# Patient Record
Sex: Female | Born: 1965 | Race: Black or African American | Hispanic: No | Marital: Single | Smoking: Never smoker
Health system: Southern US, Community
[De-identification: ages and names within clinical notes are randomized; demographics above are authoritative.]

## PROBLEM LIST (undated history)

## (undated) DIAGNOSIS — R7303 Prediabetes: Secondary | ICD-10-CM

## (undated) DIAGNOSIS — D219 Benign neoplasm of connective and other soft tissue, unspecified: Secondary | ICD-10-CM

## (undated) DIAGNOSIS — E785 Hyperlipidemia, unspecified: Secondary | ICD-10-CM

## (undated) DIAGNOSIS — R011 Cardiac murmur, unspecified: Secondary | ICD-10-CM

## (undated) DIAGNOSIS — I471 Supraventricular tachycardia: Secondary | ICD-10-CM

## (undated) DIAGNOSIS — E669 Obesity, unspecified: Secondary | ICD-10-CM

## (undated) DIAGNOSIS — T7840XA Allergy, unspecified, initial encounter: Secondary | ICD-10-CM

## (undated) HISTORY — DX: Supraventricular tachycardia: I47.1

## (undated) HISTORY — PX: NO PAST SURGERIES: SHX2092

## (undated) HISTORY — DX: Benign neoplasm of connective and other soft tissue, unspecified: D21.9

## (undated) HISTORY — DX: Prediabetes: R73.03

## (undated) HISTORY — DX: Hyperlipidemia, unspecified: E78.5

## (undated) HISTORY — DX: Allergy, unspecified, initial encounter: T78.40XA

## (undated) HISTORY — DX: Cardiac murmur, unspecified: R01.1

## (undated) HISTORY — PX: COLPOSCOPY: SHX161

## (undated) HISTORY — DX: Obesity, unspecified: E66.9

---

## 2007-03-08 DIAGNOSIS — I471 Supraventricular tachycardia, unspecified: Secondary | ICD-10-CM

## 2007-03-08 HISTORY — DX: Supraventricular tachycardia, unspecified: I47.10

## 2007-03-08 HISTORY — DX: Supraventricular tachycardia: I47.1

## 2015-03-08 DIAGNOSIS — D219 Benign neoplasm of connective and other soft tissue, unspecified: Secondary | ICD-10-CM | POA: Insufficient documentation

## 2015-03-08 HISTORY — DX: Benign neoplasm of connective and other soft tissue, unspecified: D21.9

## 2016-12-13 ENCOUNTER — Other Ambulatory Visit (HOSPITAL_COMMUNITY)
Admission: RE | Admit: 2016-12-13 | Discharge: 2016-12-13 | Disposition: A | Discharge status: Home / Self Care | Payer: Medicaid Other | Source: Ambulatory Visit | Attending: Obstetrics and Gynecology | Admitting: Obstetrics and Gynecology

## 2016-12-13 ENCOUNTER — Ambulatory Visit (INDEPENDENT_AMBULATORY_CARE_PROVIDER_SITE_OTHER): Payer: Medicaid Other | Admitting: Obstetrics and Gynecology

## 2016-12-13 ENCOUNTER — Encounter: Payer: Self-pay | Admitting: Obstetrics and Gynecology

## 2016-12-13 VITALS — BP 126/82 | HR 89 | Ht 64.0 in | Wt 175.4 lb

## 2016-12-13 DIAGNOSIS — Z113 Encounter for screening for infections with a predominantly sexual mode of transmission: Secondary | ICD-10-CM

## 2016-12-13 DIAGNOSIS — Z309 Encounter for contraceptive management, unspecified: Secondary | ICD-10-CM | POA: Diagnosis not present

## 2016-12-13 DIAGNOSIS — Z01419 Encounter for gynecological examination (general) (routine) without abnormal findings: Secondary | ICD-10-CM

## 2016-12-13 DIAGNOSIS — N939 Abnormal uterine and vaginal bleeding, unspecified: Secondary | ICD-10-CM

## 2016-12-13 HISTORY — DX: Abnormal uterine and vaginal bleeding, unspecified: N93.9

## 2016-12-13 MED ORDER — NORETHIN-ETH ESTRAD-FE BIPHAS 1 MG-10 MCG / 10 MCG PO TABS: 1.0000 | ORAL_TABLET | Freq: Every day | ORAL | 5 refills | Status: DC

## 2016-12-13 NOTE — Progress Notes (Signed)
GYNECOLOGY ANNUAL PREVENTATIVE CARE ENCOUNTER NOTE  Subjective:   Susan Rosario is a 51 y.o. G50P2002 female here for a annual gynecologic exam. Current complaints: none. Has been on OCPs for 20 months started by outside OB/GYN for fibroids with heavy bleeding, they have made her periods almost non-existent and she is very happy with this. Requesting refill. Otherwise doing well with no complaints.   Denies abnormal vaginal bleeding, discharge, pelvic pain, problems with intercourse or other gynecologic concerns.    Gynecologic History Patient's last menstrual period was 11/14/2016. Contraception: OCP (estrogen/progesterone) Last Pap: reports normal last year, results not available Last mammogram: reports 11/2015, normal  Obstetric History OB History  Gravida Para Term Preterm AB Living  2 2 2     2   SAB TAB Ectopic Multiple Live Births          2    # Outcome Date GA Lbr Len/2nd Weight Sex Delivery Anes PTL Lv  2 Term 01/22/00    M Vag-Spont   LIV  1 Term 01/19/91    [address]   LIV      Past Medical History:  Diagnosis Date  . Fibroids 2017  . Heart murmur   . SVT (supraventricular tachycardia) ([place]) 2009    History reviewed. No pertinent surgical history.  No current outpatient prescriptions on file prior to visit.   No current facility-administered medications on file prior to visit.     No Known Allergies  Social History   Social History  . Marital status: Unknown    Spouse name: N/A  . Number of children: N/A  . Years of education: N/A   Occupational History  . Not on file.   Social History Main Topics  . Smoking status: Never Smoker  . Smokeless tobacco: Never Used  . Alcohol use No  . Drug use: No  . Sexual activity: Not Currently    Birth control/ protection: Pill   Other Topics Concern  . Not on file   Social History Narrative  . No narrative on file    Family History  Problem Relation Age of Onset  . Hypertension Mother   .  Hypertension Sister     The following portions of the patient's history were reviewed and updated as appropriate: allergies, current medications, past family history, past medical history, past social history, past surgical history and problem list.  Review of Systems Pertinent items are noted in HPI.   Objective:  BP 126/82   Pulse 89   Ht 5\' 4"  (1.626 m)   Wt 175 lb 6.4 oz (79.6 kg)   LMP 11/14/2016   BMI 30.11 kg/m  CONSTITUTIONAL: Well-developed, well-nourished female in no acute distress. Somewhat diaphoretic HENT:  Normocephalic, atraumatic, External right and left ear normal. Oropharynx is clear and moist EYES: Conjunctivae and EOM are normal. Pupils are equal, round, and reactive to light. No scleral icterus.  NECK: Normal range of motion, supple, no masses.  Normal thyroid.  SKIN: Skin is warm and dry. No rash noted. Not diaphoretic. No erythema. No pallor. NEUROLOGIC: Alert and oriented to person, place, and time. Normal reflexes, muscle tone coordination. No cranial nerve deficit noted. PSYCHIATRIC: Normal mood and affect. Normal behavior. Normal judgment and thought content. CARDIOVASCULAR: Normal heart rate noted, regular rhythm RESPIRATORY: Clear to auscultation bilaterally. Effort and breath sounds normal, no problems with respiration noted. BREASTS: Symmetric in size. No masses, skin changes, nipple drainage, or lymphadenopathy. ABDOMEN: Soft, normal bowel sounds, no distention noted.  No  tenderness, rebound or guarding.  PELVIC: Normal appearing external genitalia; normal appearing vaginal mucosa and cervix.  No abnormal discharge noted.  Pap smear obtained.  Normal uterine size, no other palpable masses, no uterine or adnexal tenderness. MUSCULOSKELETAL: Normal range of motion. No tenderness.  No cyanosis, clubbing, or edema.  2+ distal pulses.   Assessment and Plan:  1. Well woman exam - reviewed menopausal onset (likely with hot flush during exam) - MM SCREENING  BREAST TOMO BILATERAL; Future  2. Routine screening for STI (sexually transmitted infection) - Cytology - PAP - Hepatitis C antibody - Hepatitis B surface antigen - HIV antibody - RPR  3. Abnormal uterine bleeding Has been on Lo Loestrin for 20 months to good effect Non-smoker Starting to get hot flushes Reviewed risks of continued OCP use for AUB including risks of CV disease, stroke, clots Patient will stop OCPs for a brief time to see if she has menopausal onset and if she resumes heavy bleeding, will restart OCPs, patient agreeable to plan  Will follow up results of pap smear/STI screen and manage accordingly. Mammogram ordered   Routine preventative health maintenance measures emphasized. Please refer to After Visit Summary for other counseling recommendations.    Feliz Beam, M.D. Attending [place], [facility] for [employer], [place]

## 2016-12-13 NOTE — Progress Notes (Signed)
Patient is in the office for annual exam, currently on BC pills for fibroids.

## 2016-12-14 LAB — HEPATITIS B SURFACE ANTIGEN: Hepatitis B Surface Ag: NEGATIVE

## 2016-12-14 LAB — HEPATITIS C ANTIBODY: Hep C Virus Ab: <0.1 s/co ratio (ref 0.0–0.9)

## 2016-12-14 LAB — RPR: RPR Ser Ql: NONREACTIVE

## 2016-12-14 LAB — HIV ANTIBODY (ROUTINE TESTING W REFLEX): HIV Screen 4th Generation wRfx: NONREACTIVE

## 2016-12-16 LAB — CYTOLOGY - PAP
Diagnosis: UNDETERMINED — AB
HPV: NOT DETECTED

## 2016-12-22 ENCOUNTER — Encounter: Payer: Self-pay | Admitting: Obstetrics and Gynecology

## 2016-12-22 DIAGNOSIS — R87610 Atypical squamous cells of undetermined significance on cytologic smear of cervix (ASC-US): Secondary | ICD-10-CM

## 2016-12-22 HISTORY — DX: Atypical squamous cells of undetermined significance on cytologic smear of cervix (ASC-US): R87.610

## 2016-12-26 ENCOUNTER — Telehealth: Payer: Self-pay | Admitting: Obstetrics and Gynecology

## 2016-12-26 NOTE — Telephone Encounter (Signed)
Called patient to see how her bleeding has been, she reports no further vaginal bleeding. Advised patient she should have endometrial biopsy to rule out cancer if she has any further bleeding. She verbalizes understanding and will call the office to schedule EMB if she has any further vaginal bleeding. Answered all questions.    Feliz Beam, M.D. Attending [place], [facility] for [employer], [place]

## 2016-12-29 ENCOUNTER — Ambulatory Visit
Admission: RE | Admit: 2016-12-29 | Discharge: 2016-12-29 | Disposition: A | Discharge status: Home / Self Care | Payer: Medicaid Other | Source: Ambulatory Visit | Attending: Obstetrics and Gynecology | Admitting: Obstetrics and Gynecology

## 2016-12-29 DIAGNOSIS — Z01419 Encounter for gynecological examination (general) (routine) without abnormal findings: Secondary | ICD-10-CM

## 2017-01-05 ENCOUNTER — Ambulatory Visit: Payer: Medicaid Other

## 2018-09-14 IMAGING — MG 2D DIGITAL SCREENING BILATERAL MAMMOGRAM WITH CAD AND ADJUNCT TO
8 of 12 series · 8 of 28 positions shown · non-contrast
Comparison: Previous exam(s).

CLINICAL DATA: Screening.

EXAM:
2D DIGITAL SCREENING BILATERAL MAMMOGRAM WITH CAD AND ADJUNCT TOMO

[L MLO]
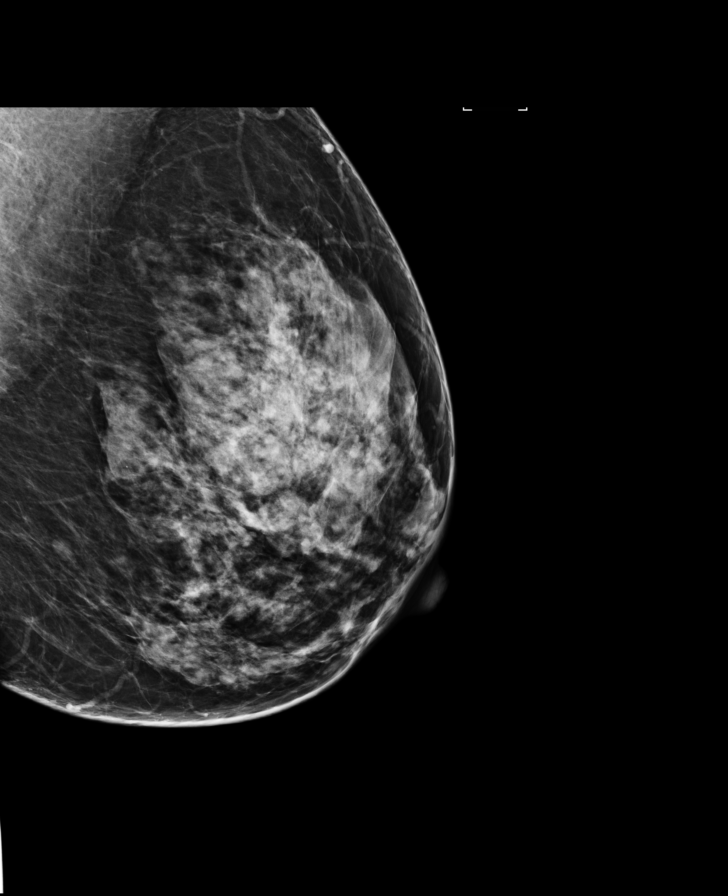

[L CC synth-2D]
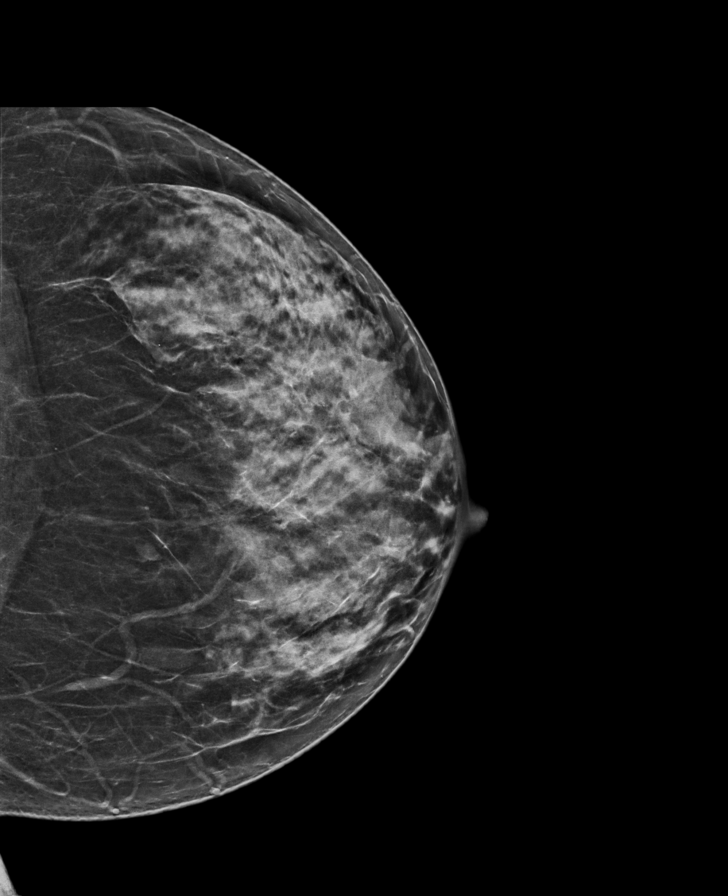

[L MLO synth-2D]
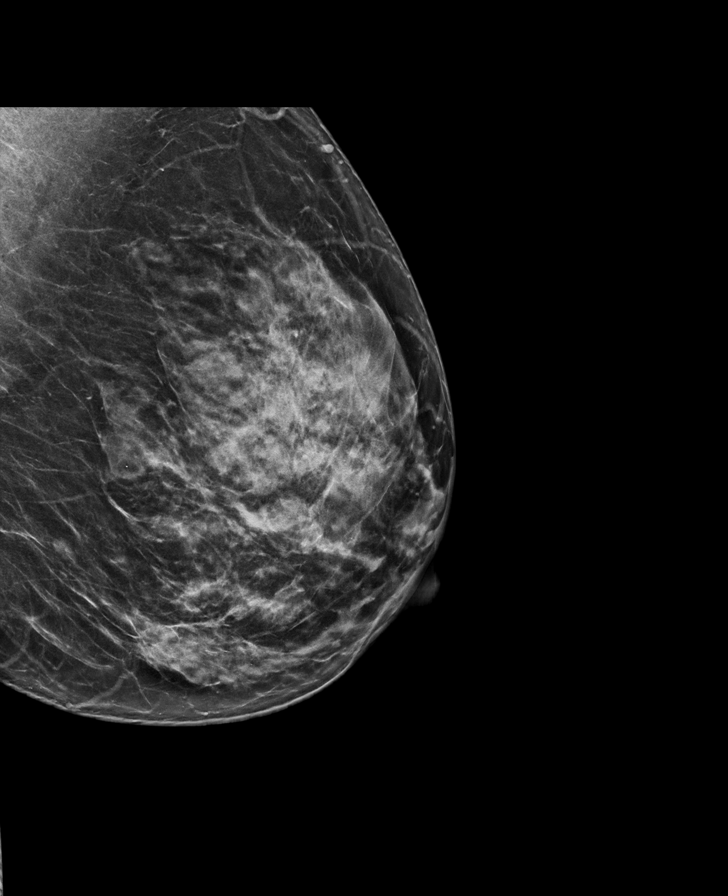

[R CC]
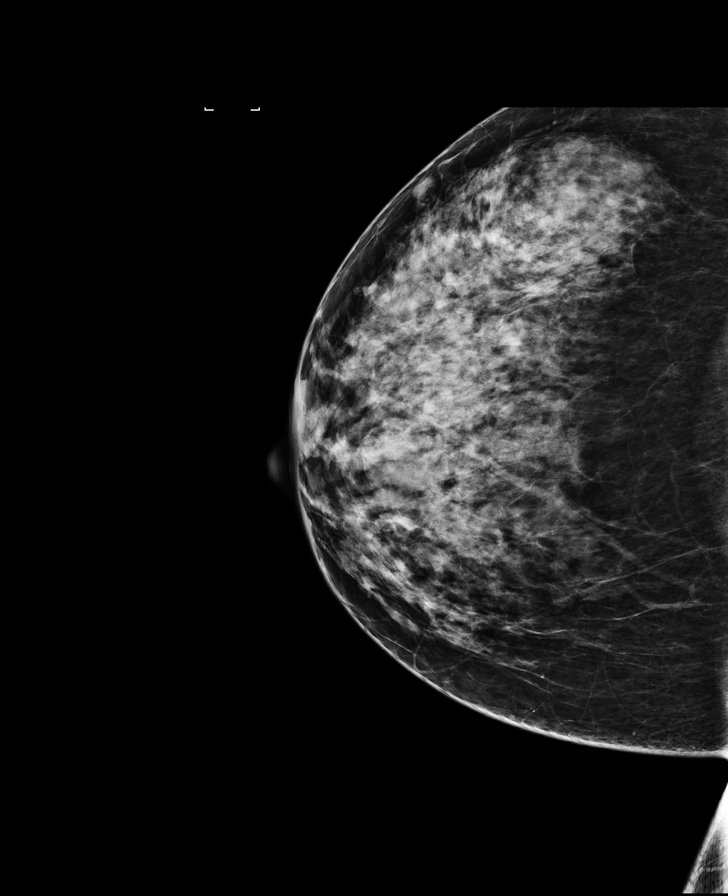

[R MLO synth-2D]
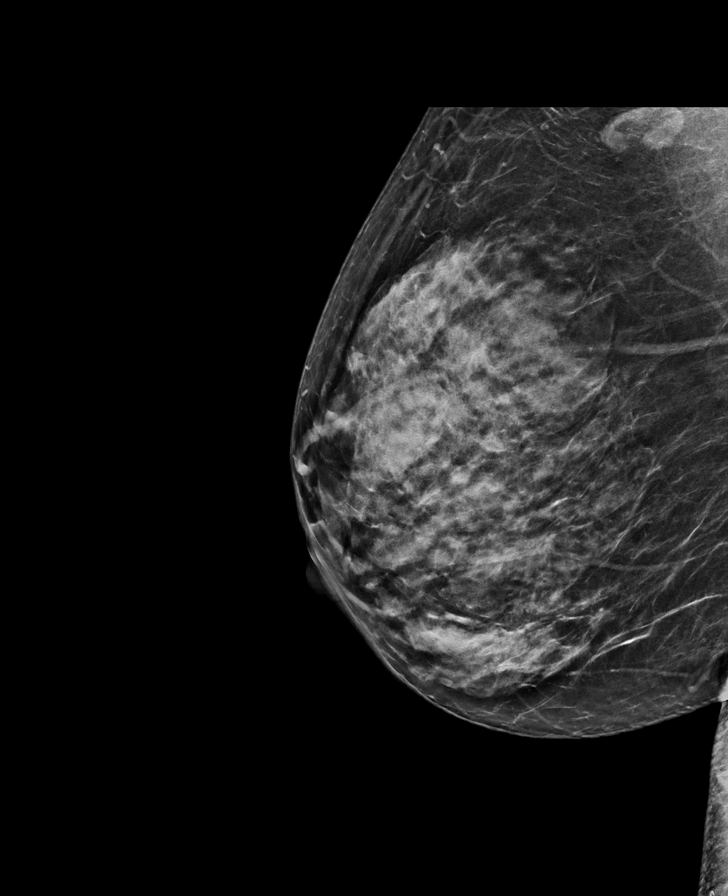

[R CC synth-2D]
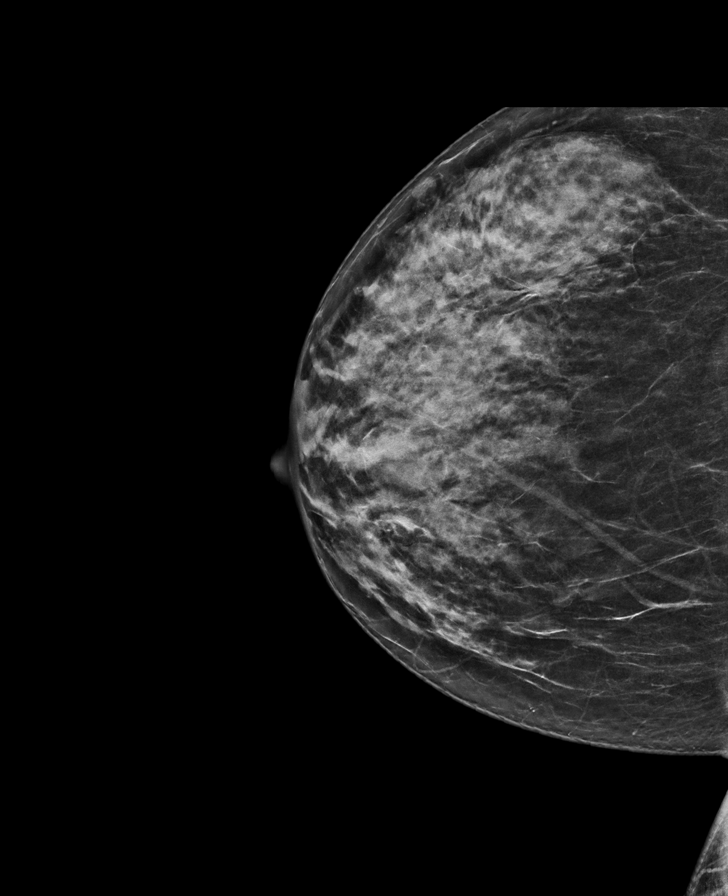

[L CC]
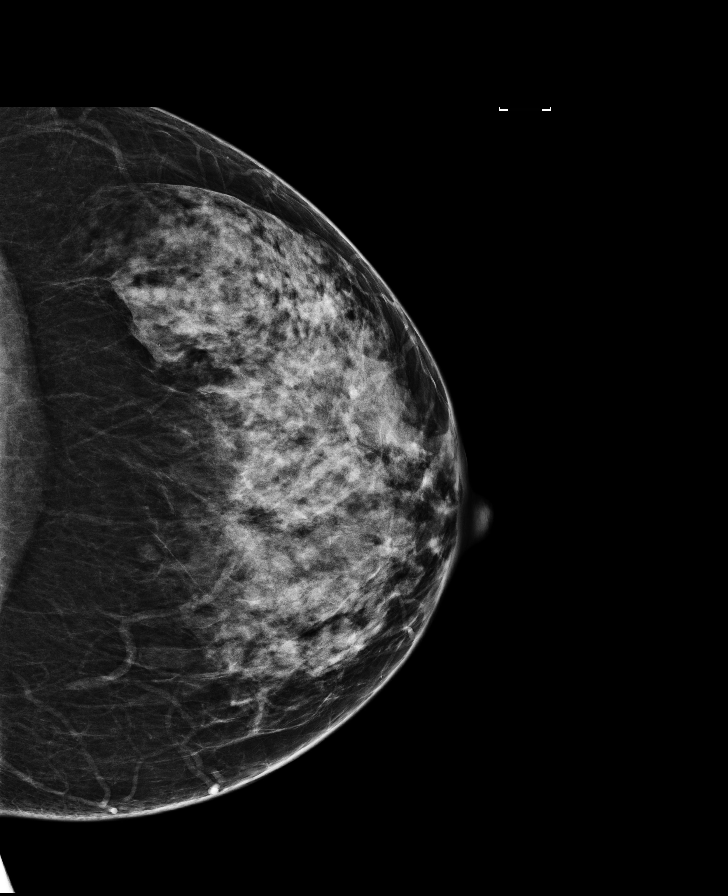

[R MLO]
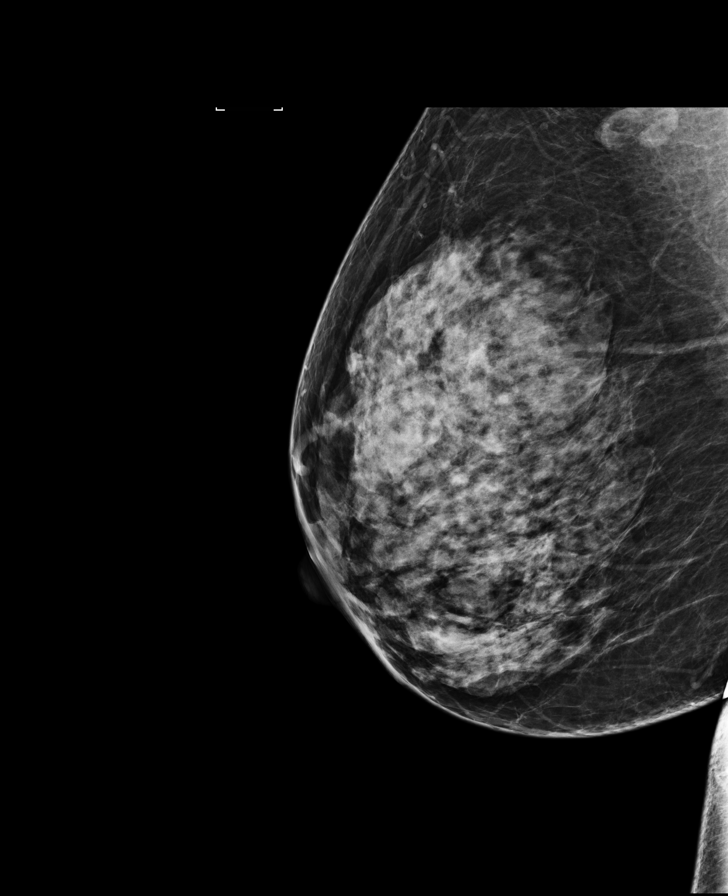

[8 of 28 positions shown; findings below may reference images not displayed]

ACR Breast Density Category c: The breast tissue is heterogeneously
dense, which may obscure small masses.
FINDINGS: There are no findings suspicious for malignancy. Images were
processed with CAD.
IMPRESSION: No mammographic evidence of malignancy. A result letter of this
screening mammogram will be mailed directly to the patient.

RECOMMENDATION:
Screening mammogram in one year. (Code:[id number])

BI-RADS CATEGORY  1: Negative.

## 2022-06-10 DIAGNOSIS — R011 Cardiac murmur, unspecified: Secondary | ICD-10-CM | POA: Diagnosis not present

## 2022-06-14 ENCOUNTER — Encounter: Payer: Self-pay | Admitting: Cardiology

## 2022-06-14 DIAGNOSIS — D219 Benign neoplasm of connective and other soft tissue, unspecified: Secondary | ICD-10-CM

## 2022-06-14 DIAGNOSIS — E785 Hyperlipidemia, unspecified: Secondary | ICD-10-CM | POA: Insufficient documentation

## 2022-06-14 DIAGNOSIS — E669 Obesity, unspecified: Secondary | ICD-10-CM | POA: Insufficient documentation

## 2022-06-14 DIAGNOSIS — R011 Cardiac murmur, unspecified: Secondary | ICD-10-CM

## 2022-07-12 ENCOUNTER — Ambulatory Visit: Payer: Medicaid Other | Admitting: Cardiology

## 2023-03-06 ENCOUNTER — Ambulatory Visit (HOSPITAL_BASED_OUTPATIENT_CLINIC_OR_DEPARTMENT_OTHER)
Admission: EM | Admit: 2023-03-06 | Discharge: 2023-03-06 | Disposition: A | Discharge status: Home / Self Care | Payer: 59 | Attending: Internal Medicine | Admitting: Internal Medicine

## 2023-03-06 ENCOUNTER — Other Ambulatory Visit (HOSPITAL_BASED_OUTPATIENT_CLINIC_OR_DEPARTMENT_OTHER): Payer: Self-pay

## 2023-03-06 ENCOUNTER — Encounter (HOSPITAL_BASED_OUTPATIENT_CLINIC_OR_DEPARTMENT_OTHER): Payer: Self-pay | Admitting: Emergency Medicine

## 2023-03-06 ENCOUNTER — Other Ambulatory Visit: Payer: Self-pay

## 2023-03-06 DIAGNOSIS — B9789 Other viral agents as the cause of diseases classified elsewhere: Secondary | ICD-10-CM | POA: Diagnosis not present

## 2023-03-06 DIAGNOSIS — J329 Chronic sinusitis, unspecified: Secondary | ICD-10-CM | POA: Diagnosis not present

## 2023-03-06 MED ORDER — GUAIFENESIN ER 1200 MG PO TB12
1200.0000 mg | ORAL_TABLET | Freq: Two times a day (BID) | ORAL | 0 refills | Status: AC
  Filled 2023-03-06: qty 14, 7d supply, fill #0

## 2023-03-06 NOTE — ED Triage Notes (Signed)
Patient presents to [facility] for evaluation of runny nose and mild cough since Christmas day.  Denies fevers.  Is concerned for sinus infection.  Says she has been taking Mucinex without relief.

## 2023-03-06 NOTE — ED Provider Notes (Signed)
[name]    CSN: [id number] Arrival date & time: 03/06/23  1036      History   Chief Complaint Chief Complaint  Patient presents with   Nasal Congestion    HPI Susan Rosario is a 57 y.o. female.   Patient presents to urgent care for evaluation of nasal congestion that started 5 days ago.  States nasal congestion is green, thick, and she has seen some streaks of blood when blowing her nose over the last 24 to 48 hours.  Denies headaches, fever, chills, body aches, cough, sore throat, dizziness, ear pain, and recent sick contacts with similar symptoms.  Never smoker, denies drug use.  No shortness of breath or chest pain.  History of seasonal allergies, currently takes over-the-counter Zyrtec for this.  She has used Flonase in the past but is not currently taking this.  Denies frequent exposures to known allergens.  Denies watery/itchy eyes.  Taking Mucinex combination pill over-the-counter without much relief.     Past Medical History:  Diagnosis Date   Abnormal uterine bleeding 12/13/2016   ASCUS of cervix with negative high risk HPV 12/22/2016   Needs repeat with co-testing 3 yrs   Cardiac murmur    Fibroids 2017   Hyperlipidemia    Obese    Prediabetes    SVT (supraventricular tachycardia) ([facility]) 2009    Patient Active Problem List   Diagnosis Date Noted   Hyperlipidemia 06/14/2022   Cardiac murmur 06/14/2022   Obese 06/14/2022   ASCUS of cervix with negative high risk HPV 12/22/2016   Abnormal uterine bleeding 12/13/2016   Fibroids 2017    Past Surgical History:  Procedure Laterality Date   NO PAST SURGERIES      OB History     Gravida  2   Para  2   Term  2   Preterm      AB      Living  2      SAB      IAB      Ectopic      Multiple      Live Births  2            Home Medications    Prior to Admission medications   Medication Sig Start Date End Date Taking? Authorizing Provider  Guaifenesin 1200 MG TB12 Take 1  tablet (1,200 mg total) by mouth in the morning and at bedtime. 03/06/23  Yes Carlisle Beers, FNP  Multiple Vitamin (MULTIVITAMIN) capsule Take 1 capsule by mouth daily.    [provider]    Family History Family History  Problem Relation Age of Onset   Hypertension Mother    Hypertension Sister    Hypertension Brother     Social History Social History   Tobacco Use   Smoking status: Never   Smokeless tobacco: Never  Substance Use Topics   Alcohol use: Yes    Comment: Less than 2 drinks a day   Drug use: No     Allergies   Patient has no known allergies.   Review of Systems Review of Systems Per HPI  Physical Exam Triage Vital Signs ED Triage Vitals  Encounter Vitals Group     BP 03/06/23 1235 121/76     Systolic BP Percentile --      Diastolic BP Percentile --      Pulse Rate 03/06/23 1235 83     Resp 03/06/23 1235 18     Temp 03/06/23 1235 98.2  F (36.8 C)     Temp Source 03/06/23 1235 Oral     SpO2 03/06/23 1235 97 %     Weight --      Height --      Head Circumference --      Peak Flow --      Pain Score 03/06/23 1233 3     Pain Loc --      Pain Education --      Exclude from Growth Chart --    No data found.  Updated Vital Signs BP 121/76 (BP Location: Right Arm)   Pulse 83   Temp 98.2 F (36.8 C) (Oral)   Resp 18   LMP 11/14/2016   SpO2 97%   Visual Acuity Right Eye Distance:   Left Eye Distance:   Bilateral Distance:    Right Eye Near:   Left Eye Near:    Bilateral Near:     Physical Exam Vitals and nursing note reviewed.  Constitutional:      Appearance: She is not ill-appearing or toxic-appearing.  HENT:     Head: Normocephalic and atraumatic.     Right Ear: Hearing, tympanic membrane, ear canal and external ear normal.     Left Ear: Hearing, tympanic membrane, ear canal and external ear normal.     Nose: Congestion present.     Comments: Nontender to palpation of the sinuses.    Mouth/Throat:     Lips:  Pink.     Mouth: Mucous membranes are moist. No injury or oral lesions.     Dentition: Normal dentition.     Tongue: No lesions.     Pharynx: Oropharynx is clear. Uvula midline. No pharyngeal swelling, oropharyngeal exudate, posterior oropharyngeal erythema, uvula swelling or postnasal drip.     Tonsils: No tonsillar exudate.  Eyes:     General: Lids are normal. Vision grossly intact. Gaze aligned appropriately.     Extraocular Movements: Extraocular movements intact.     Conjunctiva/sclera: Conjunctivae normal.  Neck:     Trachea: Trachea and phonation normal.  Cardiovascular:     Rate and Rhythm: Normal rate and regular rhythm.     Heart sounds: Normal heart sounds, S1 normal and S2 normal.  Pulmonary:     Effort: Pulmonary effort is normal. No respiratory distress.     Breath sounds: Normal breath sounds and air entry.  Musculoskeletal:     Cervical back: Neck supple.  Lymphadenopathy:     Cervical: No cervical adenopathy.  Skin:    General: Skin is warm and dry.     Capillary Refill: Capillary refill takes less than 2 seconds.     Findings: No rash.  Neurological:     General: No focal deficit present.     Mental Status: She is alert and oriented to person, place, and time. Mental status is at baseline.     Cranial Nerves: No dysarthria or facial asymmetry.  Psychiatric:        Mood and Affect: Mood normal.        Speech: Speech normal.        Behavior: Behavior normal.        Thought Content: Thought content normal.        Judgment: Judgment normal.      UC Treatments / Results  Labs (all labs ordered are listed, but only abnormal results are displayed) Labs Reviewed - No data to display  EKG   Radiology No results found.  Procedures Procedures (including critical care  time)  Medications Ordered in UC Medications - No data to display  Initial Impression / Assessment and Plan / UC Course  I have reviewed the triage vital signs and the nursing  notes.  Pertinent labs & imaging results that were available during my care of the patient were reviewed by me and considered in my medical decision making (see chart for details).   1.  Viral sinusitis Suspect viral URI, viral syndrome.  Strep/viral testing: deferred based on timing of illness. Low suspicion for bacterial sinusitis given symptoms less than 10 days.   Physical exam findings reassuring, vital signs hemodynamically stable, and lungs clear, therefore deferred imaging of the chest.  Advised supportive care/prescriptions for symptomatic relief as outlined in AVS.    Counseled patient on potential for adverse effects with medications prescribed/recommended today, strict ER and return-to-clinic precautions discussed, patient verbalized understanding.    Final Clinical Impressions(s) / UC Diagnoses   Final diagnoses:  Viral sinusitis     Discharge Instructions      You have a viral illness which will improve on its own with rest, fluids, and medications to help with your symptoms.  Tylenol, guaifenesin (plain mucinex), and saline nasal sprays may help relieve symptoms.  Flonase 2 puffs into each nostril daily. Zyrtec daily.  Two teaspoons of honey in 1 cup of warm water every 4-6 hours may help with throat pains.  Humidifier in room at nighttime may help soothe cough (clean well daily).   For chest pain, shortness of breath, inability to keep food or fluids down without vomiting, fever that does not respond to tylenol or motrin, or any other severe symptoms, please go to the ER for further evaluation. Return to urgent care as needed, otherwise follow-up with PCP.     ED Prescriptions     Medication Sig Dispense Auth. Provider   Guaifenesin 1200 MG TB12 Take 1 tablet (1,200 mg total) by mouth in the morning and at bedtime. 14 tablet Carlisle Beers, FNP      PDMP not reviewed this encounter.   Carlisle Beers, [place] 03/06/23 1320

## 2023-03-06 NOTE — Discharge Instructions (Addendum)
You have a viral illness which will improve on its own with rest, fluids, and medications to help with your symptoms.  Tylenol, guaifenesin (plain mucinex), and saline nasal sprays may help relieve symptoms.  Flonase 2 puffs into each nostril daily. Zyrtec daily.  Two teaspoons of honey in 1 cup of warm water every 4-6 hours may help with throat pains.  Humidifier in room at nighttime may help soothe cough (clean well daily).   For chest pain, shortness of breath, inability to keep food or fluids down without vomiting, fever that does not respond to tylenol or motrin, or any other severe symptoms, please go to the ER for further evaluation. Return to urgent care as needed, otherwise follow-up with PCP.

## 2023-03-09 ENCOUNTER — Ambulatory Visit (HOSPITAL_BASED_OUTPATIENT_CLINIC_OR_DEPARTMENT_OTHER): Payer: 59 | Admitting: Student

## 2023-04-24 ENCOUNTER — Ambulatory Visit (HOSPITAL_BASED_OUTPATIENT_CLINIC_OR_DEPARTMENT_OTHER): Payer: 59 | Admitting: Student

## 2023-04-24 ENCOUNTER — Encounter (HOSPITAL_BASED_OUTPATIENT_CLINIC_OR_DEPARTMENT_OTHER): Payer: Self-pay

## 2023-04-25 NOTE — Progress Notes (Deleted)
 Error. Visit canceled.

## 2023-05-01 ENCOUNTER — Other Ambulatory Visit (HOSPITAL_BASED_OUTPATIENT_CLINIC_OR_DEPARTMENT_OTHER): Payer: Self-pay

## 2023-05-01 ENCOUNTER — Ambulatory Visit (HOSPITAL_BASED_OUTPATIENT_CLINIC_OR_DEPARTMENT_OTHER): Payer: 59 | Admitting: Student

## 2023-05-01 ENCOUNTER — Encounter (HOSPITAL_BASED_OUTPATIENT_CLINIC_OR_DEPARTMENT_OTHER): Payer: Self-pay | Admitting: Student

## 2023-05-01 VITALS — BP 121/74 | HR 92 | Temp 98.4°F | Ht 63.78 in | Wt 181.3 lb

## 2023-05-01 DIAGNOSIS — Z13220 Encounter for screening for lipoid disorders: Secondary | ICD-10-CM

## 2023-05-01 DIAGNOSIS — J302 Other seasonal allergic rhinitis: Secondary | ICD-10-CM | POA: Insufficient documentation

## 2023-05-01 DIAGNOSIS — R221 Localized swelling, mass and lump, neck: Secondary | ICD-10-CM | POA: Insufficient documentation

## 2023-05-01 DIAGNOSIS — Z131 Encounter for screening for diabetes mellitus: Secondary | ICD-10-CM

## 2023-05-01 DIAGNOSIS — Z7689 Persons encountering health services in other specified circumstances: Secondary | ICD-10-CM | POA: Diagnosis not present

## 2023-05-01 DIAGNOSIS — Z1231 Encounter for screening mammogram for malignant neoplasm of breast: Secondary | ICD-10-CM

## 2023-05-01 DIAGNOSIS — Z1211 Encounter for screening for malignant neoplasm of colon: Secondary | ICD-10-CM

## 2023-05-01 MED ORDER — MONTELUKAST SODIUM 10 MG PO TABS
10.0000 mg | ORAL_TABLET | Freq: Every day | ORAL | 3 refills | Status: AC
Start: 2023-05-01 — End: ?
  Filled 2023-05-01: qty 30, 30d supply, fill #0

## 2023-05-01 NOTE — Progress Notes (Signed)
 New Patient Office Visit  Subjective    Patient ID: Susan Rosario, female    DOB: August 06, 1965  Age: 58 y.o. MRN: [id number]  CC:  Chief Complaint  Patient presents with   Establish Care    Here to establish care.   Neck Pain    Neck hurts to move it a certain way. Feels soft knot on left side of neck. Started a few months ago.     HPI Susan Rosario presents to establish care. Prior PCP was at [facility]. Last physical was more than a year ago.  Prediabetes- history of the same. New labs indicated.  Hyperlipidemia - history of the same, no statin given in the past. Labs due.  Seasonal allergies-patient notes that her seasonal allergies are not well-controlled on Zyrtec and Flonase.  Discussed utilization of nasal saline spray prior to Flonase use.  Patient is agreeable to beginning montelukast daily.  Possible use of intranasal antihistamine in the future as well such as Astepro.  Screenings:  Colon Cancer: due. No fmh of colon cancer.  Lung Cancer: not indicated. Breast Cancer: due. Cervical Cancer: goes to [facility] in [place]. Last pap smear was 2018. Diabetes: indicated HLD: indicated  The ASCVD Risk score (Arnett DK, et al., 2019) failed to calculate for the following reasons:   Cannot find a previous HDL lab   Cannot find a previous total cholesterol lab  Acute Problems: Neck Pain- Patient complains of left sided neck pain.  She states that it hurts with movement. Notes that it is sometimes worse upon awakening.  No alleviating factors noted.  She also notes that there is an associated knot on the left side of her neck where the neck pain is happening, she feels like sometimes it gets caught.  No other noted associated symptoms.  Outpatient Encounter Medications as of 05/01/2023  Medication Sig   aspirin-sod bicarb-citric acid (ALKA-SELTZER) 325 MG TBEF tablet Take 325 mg by mouth every 6 (six) hours as needed. For gas   cetirizine (ZYRTEC) 10 MG  tablet Take 10 mg by mouth daily.   fluticasone (FLONASE) 50 MCG/ACT nasal spray Place 2 sprays into both nostrils daily.   Guaifenesin 1200 MG TB12 Take 1 tablet (1,200 mg total) by mouth in the morning and at bedtime.   montelukast (SINGULAIR) 10 MG tablet Take 1 tablet (10 mg total) by mouth at bedtime.   Multiple Vitamin (MULTIVITAMIN) capsule Take 1 capsule by mouth daily. 50+   No facility-administered encounter medications on file as of 05/01/2023.    Past Medical History:  Diagnosis Date   Abnormal uterine bleeding 12/13/2016   ASCUS of cervix with negative high risk HPV 12/22/2016   Needs repeat with co-testing 3 yrs   Cardiac murmur    Fibroids 2017   Hyperlipidemia    Obese    Prediabetes    SVT (supraventricular tachycardia) ([facility]) 2009    Past Surgical History:  Procedure Laterality Date   NO PAST SURGERIES      Family History  Problem Relation Age of Onset   Hypertension Mother    Hypertension Sister    Hypertension Brother    Hypertension Brother     Social History   Socioeconomic History   Marital status: Single    Spouse name: Not on file   Number of children: 2   Years of education: Not on file   Highest education level: Not on file  Occupational History   Not on file  Tobacco Use  Smoking status: Never    Passive exposure: Never   Smokeless tobacco: Never  Vaping Use   Vaping status: Never Used  Substance and Sexual Activity   Alcohol use: Yes    Comment: occasionally   Drug use: No   Sexual activity: Not Currently    Birth control/protection: None  Other Topics Concern   Not on file  Social History Narrative   Not on file   Social Drivers of Health   Financial Resource Strain: Not on file  Food Insecurity: No Food Insecurity (05/01/2023)   Hunger Vital Sign    Worried About Running Out of Food in the Last Year: Never true    Ran Out of Food in the Last Year: Never true  Transportation Needs: No Transportation Needs (05/01/2023)    PRAPARE - Transportation    Lack of Transportation (Medical): No    Lack of Transportation (Non-Medical): No  Physical Activity: Not on file  Stress: Not on file  Social Connections: Not on file  Intimate Partner Violence: Not At Risk (05/01/2023)   Humiliation, Afraid, Rape, and Kick questionnaire    Fear of Current or Ex-Partner: No    Emotionally Abused: No    Physically Abused: No    Sexually Abused: No    ROS  Per HPI      Objective    BP 121/74 (BP Location: Right Arm, Patient Position: Sitting, Cuff Size: Normal)   Pulse 92   Temp 98.4 F (36.9 C) (Oral)   Ht 5' 3.78" (1.62 m)   Wt 181 lb 4.8 oz (82.2 kg)   LMP 11/14/2016   SpO2 96%   BMI 31.34 kg/m   Physical Exam Constitutional:      General: She is not in acute distress.    Appearance: Normal appearance. She is not ill-appearing.  HENT:     Head: Normocephalic and atraumatic.     Right Ear: External ear normal.     Left Ear: External ear normal.     Nose: Nose normal.     Mouth/Throat:     Mouth: Mucous membranes are moist.     Pharynx: Oropharynx is clear.  Eyes:     General: No scleral icterus.    Extraocular Movements: Extraocular movements intact.     Conjunctiva/sclera: Conjunctivae normal.     Pupils: Pupils are equal, round, and reactive to light.  Neck:     Vascular: No carotid bruit.     Comments: Small left-sided mass-slightly inferior posterior to left ear.  Nodule is firm and not easily mobile. Cardiovascular:     Rate and Rhythm: Normal rate and regular rhythm.     Pulses: Normal pulses.     Heart sounds: Normal heart sounds. No murmur heard.    No friction rub.  Pulmonary:     Effort: Pulmonary effort is normal. No respiratory distress.     Breath sounds: Normal breath sounds. No wheezing, rhonchi or rales.  Musculoskeletal:        General: Normal range of motion.     Cervical back: Normal range of motion and neck supple. No rigidity or tenderness.     Right lower leg: No edema.      Left lower leg: No edema.  Lymphadenopathy:     Cervical: No cervical adenopathy.  Skin:    General: Skin is warm and dry.     Coloration: Skin is not jaundiced or pale.  Neurological:     General: No focal deficit present.  Mental Status: She is alert.  Psychiatric:        Mood and Affect: Mood normal.        Behavior: Behavior normal.         Assessment & Plan:   Encounter to establish care  Neck nodule Assessment & Plan: Firm nodule on left side of neck.  Will further assess with ultrasound due to chronic nature.  Orders: -     US SOFT TISSUE HEAD & NECK (NON-THYROID); Future  Seasonal allergies Assessment & Plan: Not well-controlled on Zyrtec and Flonase. Discussed use of nasal saline spray prior to Flonase use. Start Singulair daily.  Orders: -     Montelukast Sodium; Take 1 tablet (10 mg total) by mouth at bedtime.  Dispense: 30 tablet; Refill: 3 -     CBC with Differential/Platelet; Future -     Comprehensive metabolic panel; Future  Encounter for screening mammogram for malignant neoplasm of breast -     3D Screening Mammogram, Left and Right; Future  Screen for colon cancer -     Cologuard  Encounter for lipid screening for cardiovascular disease -     Lipid panel; Future  Screening for diabetes mellitus -     Hemoglobin A1c; Future   I have spent greater than 30 minutes charting, educating, diagnosing and managing this patient for this visit.   Return in about 3 months (around 07/29/2023) for Annual Physical.   Teryl Lucy Nesa Distel, PA-C

## 2023-05-01 NOTE — Patient Instructions (Addendum)
 It was nice to see you today!  As we discussed in clinic I will send for an ultrasound of your neck to check out the nodule.   If you have any problems before your next visit feel free to message me via MyChart (minor issues or questions) or call the office, otherwise you may reach out to schedule an office visit.  Thank you! Gerilyn Pilgrim Laquitha Heslin, PA-C

## 2023-05-01 NOTE — Assessment & Plan Note (Signed)
 Firm nodule on left side of neck.  Will further assess with ultrasound due to chronic nature.

## 2023-05-01 NOTE — Assessment & Plan Note (Signed)
 Not well-controlled on Zyrtec and Flonase. Discussed use of nasal saline spray prior to Flonase use. Start Singulair daily.

## 2023-05-04 NOTE — Progress Notes (Signed)
 error

## 2023-05-05 ENCOUNTER — Other Ambulatory Visit (HOSPITAL_BASED_OUTPATIENT_CLINIC_OR_DEPARTMENT_OTHER): Payer: 59

## 2023-05-05 ENCOUNTER — Ambulatory Visit (HOSPITAL_BASED_OUTPATIENT_CLINIC_OR_DEPARTMENT_OTHER)
Admission: RE | Admit: 2023-05-05 | Discharge: 2023-05-05 | Disposition: A | Discharge status: Home / Self Care | Payer: 59 | Source: Ambulatory Visit | Attending: Student | Admitting: Student

## 2023-05-05 ENCOUNTER — Encounter (HOSPITAL_BASED_OUTPATIENT_CLINIC_OR_DEPARTMENT_OTHER): Payer: Self-pay | Admitting: Radiology

## 2023-05-05 DIAGNOSIS — Z1231 Encounter for screening mammogram for malignant neoplasm of breast: Secondary | ICD-10-CM

## 2023-05-05 DIAGNOSIS — J302 Other seasonal allergic rhinitis: Secondary | ICD-10-CM | POA: Diagnosis not present

## 2023-05-05 DIAGNOSIS — E785 Hyperlipidemia, unspecified: Secondary | ICD-10-CM | POA: Diagnosis not present

## 2023-05-05 DIAGNOSIS — R221 Localized swelling, mass and lump, neck: Secondary | ICD-10-CM | POA: Diagnosis not present

## 2023-05-05 DIAGNOSIS — Z131 Encounter for screening for diabetes mellitus: Secondary | ICD-10-CM | POA: Diagnosis not present

## 2023-05-06 LAB — CBC WITH DIFFERENTIAL/PLATELET
Basophils Absolute: 0 10*3/uL (ref 0.0–0.2)
Basos: 0 %
EOS (ABSOLUTE): 0.1 10*3/uL (ref 0.0–0.4)
Eos: 1 %
Hematocrit: 41.5 % (ref 34.0–46.6)
Hemoglobin: 13 g/dL (ref 11.1–15.9)
Immature Grans (Abs): 0 10*3/uL (ref 0.0–0.1)
Immature Granulocytes: 0 %
Lymphocytes Absolute: 3.3 10*3/uL — ABNORMAL HIGH (ref 0.7–3.1)
Lymphs: 67 %
MCH: 23.1 pg — ABNORMAL LOW (ref 26.6–33.0)
MCHC: 31.3 g/dL — ABNORMAL LOW (ref 31.5–35.7)
MCV: 74 fL — ABNORMAL LOW (ref 79–97)
Monocytes Absolute: 0.5 10*3/uL (ref 0.1–0.9)
Monocytes: 10 %
Neutrophils Absolute: 1.1 10*3/uL — ABNORMAL LOW (ref 1.4–7.0)
Neutrophils: 22 %
Platelets: 235 10*3/uL (ref 150–450)
RBC: 5.63 x10E6/uL — ABNORMAL HIGH (ref 3.77–5.28)
RDW: 16.1 % — ABNORMAL HIGH (ref 11.7–15.4)
WBC: 4.9 10*3/uL (ref 3.4–10.8)

## 2023-05-06 LAB — COMPREHENSIVE METABOLIC PANEL
ALT: 32 IU/L (ref 0–32)
AST: 31 IU/L (ref 0–40)
Albumin: 4.7 g/dL (ref 3.8–4.9)
Alkaline Phosphatase: 77 IU/L (ref 44–121)
BUN/Creatinine Ratio: 17 (ref 9–23)
BUN: 11 mg/dL (ref 6–24)
Bilirubin Total: 0.3 mg/dL (ref 0.0–1.2)
CO2: 22 mmol/L (ref 20–29)
Calcium: 9.6 mg/dL (ref 8.7–10.2)
Chloride: 104 mmol/L (ref 96–106)
Creatinine, Ser: 0.63 mg/dL (ref 0.57–1.00)
Globulin, Total: 3 g/dL (ref 1.5–4.5)
Glucose: 93 mg/dL (ref 70–99)
Potassium: 4.3 mmol/L (ref 3.5–5.2)
Sodium: 144 mmol/L (ref 134–144)
Total Protein: 7.7 g/dL (ref 6.0–8.5)
eGFR: 103 mL/min/{1.73_m2} (ref >59)

## 2023-05-06 LAB — LIPID PANEL
Chol/HDL Ratio: 5 ratio — ABNORMAL HIGH (ref 0.0–4.4)
Cholesterol, Total: 204 mg/dL — ABNORMAL HIGH (ref 100–199)
HDL: 41 mg/dL (ref >39)
LDL Chol Calc (NIH): 143 mg/dL — ABNORMAL HIGH (ref 0–99)
Triglycerides: 113 mg/dL (ref 0–149)
VLDL Cholesterol Cal: 20 mg/dL (ref 5–40)

## 2023-05-06 LAB — HEMOGLOBIN A1C
Est. average glucose Bld gHb Est-mCnc: 128 mg/dL
Hgb A1c MFr Bld: 6.1 % — ABNORMAL HIGH (ref 4.8–5.6)

## 2023-05-09 ENCOUNTER — Encounter (HOSPITAL_BASED_OUTPATIENT_CLINIC_OR_DEPARTMENT_OTHER): Payer: Self-pay | Admitting: Student

## 2023-07-28 ENCOUNTER — Encounter (HOSPITAL_BASED_OUTPATIENT_CLINIC_OR_DEPARTMENT_OTHER): Payer: 59 | Admitting: Student

## 2023-08-04 ENCOUNTER — Encounter (HOSPITAL_BASED_OUTPATIENT_CLINIC_OR_DEPARTMENT_OTHER): Admitting: Student

## 2023-08-16 ENCOUNTER — Encounter (HOSPITAL_BASED_OUTPATIENT_CLINIC_OR_DEPARTMENT_OTHER): Admitting: Student

## 2023-08-21 ENCOUNTER — Encounter (HOSPITAL_BASED_OUTPATIENT_CLINIC_OR_DEPARTMENT_OTHER): Admitting: Student

## 2023-08-23 ENCOUNTER — Encounter (HOSPITAL_BASED_OUTPATIENT_CLINIC_OR_DEPARTMENT_OTHER): Admitting: Student

## 2023-08-29 ENCOUNTER — Encounter (INDEPENDENT_AMBULATORY_CARE_PROVIDER_SITE_OTHER): Payer: Self-pay

## 2023-08-29 ENCOUNTER — Other Ambulatory Visit (HOSPITAL_BASED_OUTPATIENT_CLINIC_OR_DEPARTMENT_OTHER): Payer: Self-pay | Admitting: Student

## 2023-08-29 ENCOUNTER — Ambulatory Visit (INDEPENDENT_AMBULATORY_CARE_PROVIDER_SITE_OTHER): Admitting: Student

## 2023-08-29 ENCOUNTER — Encounter (HOSPITAL_BASED_OUTPATIENT_CLINIC_OR_DEPARTMENT_OTHER): Payer: Self-pay | Admitting: Student

## 2023-08-29 ENCOUNTER — Other Ambulatory Visit (HOSPITAL_COMMUNITY)
Admission: RE | Admit: 2023-08-29 | Discharge: 2023-08-29 | Disposition: A | Discharge status: Home / Self Care | Source: Ambulatory Visit | Attending: Student | Admitting: Student

## 2023-08-29 VITALS — BP 129/80 | HR 81 | Temp 98.1°F | Resp 16 | Ht 63.0 in | Wt 181.2 lb

## 2023-08-29 DIAGNOSIS — Z0000 Encounter for general adult medical examination without abnormal findings: Secondary | ICD-10-CM | POA: Insufficient documentation

## 2023-08-29 DIAGNOSIS — R7303 Prediabetes: Secondary | ICD-10-CM | POA: Insufficient documentation

## 2023-08-29 DIAGNOSIS — Z124 Encounter for screening for malignant neoplasm of cervix: Secondary | ICD-10-CM | POA: Insufficient documentation

## 2023-08-29 DIAGNOSIS — Z13220 Encounter for screening for lipoid disorders: Secondary | ICD-10-CM

## 2023-08-29 DIAGNOSIS — Z136 Encounter for screening for cardiovascular disorders: Secondary | ICD-10-CM

## 2023-08-29 DIAGNOSIS — R221 Localized swelling, mass and lump, neck: Secondary | ICD-10-CM

## 2023-08-29 NOTE — Progress Notes (Signed)
 Complete physical exam  Patient: Susan Rosario   DOB: 09/30/1965   58 y.o. Female  MRN: [id number]  Subjective:    Chief Complaint  Patient presents with   Annual Exam    Here for physical. Due for colonoscopy referral (ok w/ pt) & papsmear. TDAP & Shingrix due, pt will do later. Would like lipid rechecked. Fasting.   Neck Pain    Would like more imaging done on neck. Did x-ray and it was normal but does wake up sore.    Discussed the use of AI scribe software for clinical note transcription with the patient, who gave verbal consent to proceed.  History of Present Illness   Susan Rosario is a 58 year old female who presents for an annual physical exam and evaluation of neck concerns.  She has a persistent lump behind her ear, described as a lymph node, present for several years. She previously underwent an ultrasound for her neck but does not recall the details.  She has never had a colonoscopy and has no family history of colon cancer. She is considering alternative screening methods.  Her last Pap smear was in 2019 or 2020, following an abnormal result in 2018 that was managed with birth control due to heavy bleeding. No further issues have been reported since then.  She has a history of severe shingles, which was documented by her mother with photographs.  Her diet includes potato chips and sweets, and she eats out about three times a week at places like [employer]. Her A1c is 6.1, indicating prediabetes. Diabetes is present on her father's side of the family.  She is trying to increase her physical activity, currently exercising once a week with plans to increase to five days a week. She aims for daily walks during the summer.  She sleeps well, getting six to seven hours per night, though she wakes up early as if she has to go to work. She feels well overall and enjoys spending time with her six grandchildren.  She follows up with an eye doctor annually and has a dental  appointment scheduled for next month.       Most recent fall risk assessment:     No data to display           Most recent depression screenings:    08/29/2023    9:13 AM 05/01/2023    3:45 PM  PHQ 2/9 Scores  PHQ - 2 Score 0 0  PHQ- 9 Score  0    Vision:Within last year and Dental: No current dental problems and Receives regular dental care  Patient Active Problem List   Diagnosis Date Noted   Pap smear for cervical cancer screening 08/29/2023   Prediabetes 08/29/2023   Adult general medical exam 08/29/2023   Neck nodule 05/01/2023   Seasonal allergies 05/01/2023   Hyperlipidemia 06/14/2022   Cardiac murmur 06/14/2022   Obese 06/14/2022   ASCUS of cervix with negative high risk HPV 12/22/2016   Abnormal uterine bleeding 12/13/2016   Fibroids 2017   Past Medical History:  Diagnosis Date   Abnormal uterine bleeding 12/13/2016   Allergy    seasonal   ASCUS of cervix with negative high risk HPV 12/22/2016   Needs repeat with co-testing 3 yrs   Cardiac murmur    Fibroids 2017   Hyperlipidemia    Obese    Prediabetes    SVT (supraventricular tachycardia) (HCC) 2009   Social History   Tobacco Use   Smoking status: Never  Passive exposure: Never   Smokeless tobacco: Never  Vaping Use   Vaping status: Never Used  Substance Use Topics   Alcohol use: Yes    Alcohol/week: 1.0 standard drink of alcohol    Types: 1 Glasses of wine per week    Comment: occasionally   Drug use: No   No Known Allergies    Patient Care Team: Susan Rosario T, PA-C as PCP - General (Physician Assistant)   Outpatient Medications Prior to Visit  Medication Sig   aspirin-sod bicarb-citric acid (ALKA-SELTZER) 325 MG TBEF tablet Take 325 mg by mouth every 6 (six) hours as needed. For gas   cetirizine (ZYRTEC) 10 MG tablet Take 10 mg by mouth daily.   fluticasone (FLONASE) 50 MCG/ACT nasal spray Place 2 sprays into both nostrils daily.   Multiple Vitamin (MULTIVITAMIN) capsule  Take 1 capsule by mouth daily. 50+   Guaifenesin  1200 MG TB12 Take 1 tablet (1,200 mg total) by mouth in the morning and at bedtime. (Patient not taking: Reported on 08/29/2023)   montelukast  (SINGULAIR ) 10 MG tablet Take 1 tablet (10 mg total) by mouth at bedtime. (Patient not taking: Reported on 08/29/2023)   No facility-administered medications prior to visit.    ROS  Per HPI    Objective:     BP 129/80   Pulse 81   Temp 98.1 F (36.7 C) (Oral)   Resp 16   Ht 5' 3 (1.6 m)   Wt 181 lb 3.2 oz (82.2 kg)   LMP 11/14/2016   SpO2 97%   BMI 32.10 kg/m  BP Readings from Last 3 Encounters:  08/29/23 129/80  05/01/23 121/74  03/06/23 121/76   Wt Readings from Last 3 Encounters:  08/29/23 181 lb 3.2 oz (82.2 kg)  05/01/23 181 lb 4.8 oz (82.2 kg)  04/24/23 180 lb 11.2 oz (82 kg)      Physical Exam Vitals reviewed: Carlie was the Chaperone. Exam conducted with a chaperone present.  Constitutional:      General: She is not in acute distress.    Appearance: Normal appearance. She is not ill-appearing or diaphoretic.  HENT:     Head: Normocephalic and atraumatic.     Right Ear: Tympanic membrane, ear canal and external ear normal.     Left Ear: Tympanic membrane, ear canal and external ear normal.     Nose: Nose normal.     Mouth/Throat:     Mouth: Mucous membranes are moist.     Pharynx: Oropharynx is clear.   Eyes:     General: No scleral icterus.       Right eye: No discharge.        Left eye: No discharge.     Extraocular Movements: Extraocular movements intact.     Conjunctiva/sclera: Conjunctivae normal.     Pupils: Pupils are equal, round, and reactive to light.   Neck:     Thyroid : No thyroid  mass, thyromegaly or thyroid  tenderness.     Vascular: No carotid bruit.     Comments: R sided enlargement of periauricular lymph node.  Left side of neck patient points out nodule but this was not easily palpated. Cardiovascular:     Rate and Rhythm: Normal rate and  regular rhythm.     Pulses: Normal pulses.     Heart sounds: Normal heart sounds. No murmur heard.    No friction rub. No gallop.  Pulmonary:     Effort: Pulmonary effort is normal.     Breath sounds: Normal  breath sounds. No wheezing, rhonchi or rales.  Chest:     Chest wall: No tenderness.  Abdominal:     General: Bowel sounds are normal.  Genitourinary:    General: Normal vulva.     Pubic Area: No rash or pubic lice.      Labia:        Right: No rash, tenderness or lesion.        Left: No rash, tenderness or lesion.      Vagina: Normal.     Cervix: Normal.     Uterus: Normal.      Adnexa: Right adnexa normal and left adnexa normal.   Musculoskeletal:        General: No swelling, deformity or signs of injury.     Cervical back: Neck supple.     Right lower leg: No edema.     Left lower leg: No edema.  Lymphadenopathy:     Cervical: No cervical adenopathy.     Right cervical: No superficial or posterior cervical adenopathy.    Left cervical: No superficial cervical adenopathy.   Skin:    Coloration: Skin is not jaundiced.     Findings: No rash.   Neurological:     General: No focal deficit present.     Mental Status: She is alert and oriented to person, place, and time.     Deep Tendon Reflexes: Reflexes normal.   Psychiatric:        Behavior: Behavior normal.      No results found for any visits on 08/29/23. Last CBC Lab Results  Component Value Date   WBC 4.9 05/05/2023   HGB 13.0 05/05/2023   HCT 41.5 05/05/2023   MCV 74 (L) 05/05/2023   MCH 23.1 (L) 05/05/2023   RDW 16.1 (H) 05/05/2023   PLT 235 05/05/2023   Last metabolic panel Lab Results  Component Value Date   GLUCOSE 93 05/05/2023   NA 144 05/05/2023   K 4.3 05/05/2023   CL 104 05/05/2023   CO2 22 05/05/2023   BUN 11 05/05/2023   CREATININE 0.63 05/05/2023   EGFR 103 05/05/2023   CALCIUM 9.6 05/05/2023   PROT 7.7 05/05/2023   ALBUMIN 4.7 05/05/2023   LABGLOB 3.0 05/05/2023   BILITOT  0.3 05/05/2023   ALKPHOS 77 05/05/2023   AST 31 05/05/2023   ALT 32 05/05/2023   Last lipids Lab Results  Component Value Date   CHOL 204 (H) 05/05/2023   HDL 41 05/05/2023   LDLCALC 143 (H) 05/05/2023   TRIG 113 05/05/2023   CHOLHDL 5.0 (H) 05/05/2023   Last hemoglobin A1c Lab Results  Component Value Date   HGBA1C 6.1 (H) 05/05/2023        Assessment & Plan:    Routine Health Maintenance and Physical Exam  Health Maintenance  Topic Date Due   DTaP/Tdap/Td vaccine (1 - Tdap) Never done   Hepatitis B Vaccine (1 of 3 - 19+ 3-dose series) Never done   Colon Cancer Screening  Never done   Zoster (Shingles) Vaccine (1 of 2) Never done   Pap with HPV screening  12/13/2021   COVID-19 Vaccine (1 - 2024-25 season) 08/28/2024*   Flu Shot  10/06/2023   Mammogram  05/04/2025   Hepatitis C Screening  Completed   HIV Screening  Completed   HPV Vaccine  Aged Out   Meningitis B Vaccine  Aged Out  *Topic was postponed. The date shown is not the original due date.    Encouraged  her to engage in regular exercise appropriate for her age and condition.  Adult general medical exam Assessment & Plan: She is due for Tdap and shingles vaccinations. She has a history of chickenpox and a severe case of shingles in the past. Advised to get the shingles vaccine on a Friday to allow for recovery over the weekend. Also advised to maintain regular eye and dental check-ups. - Administer Tdap and shingles vaccines at a future visit. - Continue regular follow-up with eye doctor and dentist.      Orders: -     CBC with Differential/Platelet -     Comprehensive metabolic panel with GFR -     Lipid panel -     Cytology - PAP  Encounter for lipid screening for cardiovascular disease -     Lipid panel  Neck nodule Assessment & Plan: She reports a neck mass and a palpable lymph node behind her ear, both present for several years. Previous ultrasound was performed. Further evaluation by an ENT  specialist is recommended to determine if additional imaging or intervention is needed. - Refer to ENT for further evaluation of neck mass.  Orders: -     Ambulatory referral to ENT  Pap smear for cervical cancer screening -     Cytology - PAP  Prediabetes Assessment & Plan: She has prediabetes with an A1c of 6.1. Advised to reduce carbohydrate intake, particularly from white bread, potatoes, and rice, and to increase whole grains. Encouraged to increase physical activity to at least 30 minutes of brisk walking five days a week. Excess deli meats and white breads can increase risk of colon cancer and spike blood sugar levels, respectively. - Advise dietary modifications to reduce carbohydrate intake. - Encourage physical activity with a goal of 30 minutes of brisk walking five days a week.   Return in about 1 year (around 08/28/2024) for Annual Physical.     Maci Eickholt T Nereida Schepp, PA-C

## 2023-08-29 NOTE — Assessment & Plan Note (Signed)
 She is due for Tdap and shingles vaccinations. She has a history of chickenpox and a severe case of shingles in the past. Advised to get the shingles vaccine on a Friday to allow for recovery over the weekend. Also advised to maintain regular eye and dental check-ups. - Administer Tdap and shingles vaccines at a future visit. - Continue regular follow-up with eye doctor and dentist.

## 2023-08-29 NOTE — Assessment & Plan Note (Signed)
 She has prediabetes with an A1c of 6.1. Advised to reduce carbohydrate intake, particularly from white bread, potatoes, and rice, and to increase whole grains. Encouraged to increase physical activity to at least 30 minutes of brisk walking five days a week. Excess deli meats and white breads can increase risk of colon cancer and spike blood sugar levels, respectively. - Advise dietary modifications to reduce carbohydrate intake. - Encourage physical activity with a goal of 30 minutes of brisk walking five days a week.

## 2023-08-29 NOTE — Patient Instructions (Addendum)
 It was nice to see you today!  If you have any problems before your next visit feel free to message me via MyChart (minor issues or questions) or call the office, otherwise you may reach out to schedule an office visit.  Thank you! Janaiya Beauchesne, PA-C  Health Maintenance, Female Adopting a healthy lifestyle and getting preventive care are important in promoting health and wellness. Ask your health care provider about: The right schedule for you to have regular tests and exams. Things you can do on your own to prevent diseases and keep yourself healthy. What should I know about diet, weight, and exercise? Eat a healthy diet  Eat a diet that includes plenty of vegetables, fruits, low-fat dairy products, and lean protein. Do not eat a lot of foods that are high in solid fats, added sugars, or sodium. Maintain a healthy weight Body mass index (BMI) is used to identify weight problems. It estimates body fat based on height and weight. Your health care provider can help determine your BMI and help you achieve or maintain a healthy weight. Get regular exercise Get regular exercise. This is one of the most important things you can do for your health. Most adults should: Exercise for at least 150 minutes each week. The exercise should increase your heart rate and make you sweat (moderate-intensity exercise). Do strengthening exercises at least twice a week. This is in addition to the moderate-intensity exercise. Spend less time sitting. Even light physical activity can be beneficial. Watch cholesterol and blood lipids Have your blood tested for lipids and cholesterol at 59 years of age, then have this test every 5 years. Have your cholesterol levels checked more often if: Your lipid or cholesterol levels are high. You are older than 59 years of age. You are at high risk for heart disease. What should I know about cancer screening? Depending on your health history and family history, you may need  to have cancer screening at various ages. This may include screening for: Breast cancer. Cervical cancer. Colorectal cancer. Skin cancer. Lung cancer. What should I know about heart disease, diabetes, and high blood pressure? Blood pressure and heart disease High blood pressure causes heart disease and increases the risk of stroke. This is more likely to develop in people who have high blood pressure readings or are overweight. Have your blood pressure checked: Every 3-5 years if you are 55-58 years of age. Every year if you are 44 years old or older. Diabetes Have regular diabetes screenings. This checks your fasting blood sugar level. Have the screening done: Once every three years after age 38 if you are at a normal weight and have a low risk for diabetes. More often and at a younger age if you are overweight or have a high risk for diabetes. What should I know about preventing infection? Hepatitis B If you have a higher risk for hepatitis B, you should be screened for this virus. Talk with your health care provider to find out if you are at risk for hepatitis B infection. Hepatitis C Testing is recommended for: Everyone born from [birth year] through 1966. Anyone with known risk factors for hepatitis C. Sexually transmitted infections (STIs) Get screened for STIs, including gonorrhea and chlamydia, if: You are sexually active and are younger than 59 years of age. You are older than 59 years of age and your health care provider tells you that you are at risk for this type of infection. Your sexual activity has changed since you were  last screened, and you are at increased risk for chlamydia or gonorrhea. Ask your health care provider if you are at risk. Ask your health care provider about whether you are at high risk for HIV. Your health care provider may recommend a prescription medicine to help prevent HIV infection. If you choose to take medicine to prevent HIV, you should first get tested  for HIV. You should then be tested every 3 months for as long as you are taking the medicine. Pregnancy If you are about to stop having your period (premenopausal) and you may become pregnant, seek counseling before you get pregnant. Take 400 to 800 micrograms (mcg) of folic acid every day if you become pregnant. Ask for birth control (contraception) if you want to prevent pregnancy. Osteoporosis and menopause Osteoporosis is a disease in which the bones lose minerals and strength with aging. This can result in bone fractures. If you are 56 years old or older, or if you are at risk for osteoporosis and fractures, ask your health care provider if you should: Be screened for bone loss. Take a calcium or vitamin D supplement to lower your risk of fractures. Be given hormone replacement therapy (HRT) to treat symptoms of menopause. Follow these instructions at home: Alcohol use Do not drink alcohol if: Your health care provider tells you not to drink. You are pregnant, may be pregnant, or are planning to become pregnant. If you drink alcohol: Limit how much you have to: 0-1 drink a day. Know how much alcohol is in your drink. In the [place] one drink equals one 12 oz bottle of beer (355 mL), one 5 oz glass of wine (148 mL), or one 1 oz glass of hard liquor (44 mL). Lifestyle Do not use any products that contain nicotine or tobacco. These products include cigarettes, chewing tobacco, and vaping devices, such as e-cigarettes. If you need help quitting, ask your health care provider. Do not use street drugs. Do not share needles. Ask your health care provider for help if you need support or information about quitting drugs. General instructions Schedule regular health, dental, and eye exams. Stay current with your vaccines. Tell your health care provider if: You often feel depressed. You have ever been abused or do not feel safe at home. Summary Adopting a healthy lifestyle and getting  preventive care are important in promoting health and wellness. Follow your health care provider's instructions about healthy diet, exercising, and getting tested or screened for diseases. Follow your health care provider's instructions on monitoring your cholesterol and blood pressure. This information is not intended to replace advice given to you by your health care provider. Make sure you discuss any questions you have with your health care provider. Document Revised: 07/13/2020 Document Reviewed: 07/13/2020 Elsevier Patient Education  2024 Elsevier Inc.    Why follow it? Research shows. Those who follow the Mediterranean diet have a reduced risk of heart disease  The diet is associated with a reduced incidence of Parkinson's and Alzheimer's diseases People following the diet may have longer life expectancies and lower rates of chronic diseases  The Dietary Guidelines for Americans recommends the Mediterranean diet as an eating plan to promote health and prevent disease  What Is the Mediterranean Diet?  Healthy eating plan based on typical foods and recipes of Mediterranean-style cooking The diet is primarily a plant based diet; these foods should make up a majority of meals   Starches - Plant based foods should make up a majority of meals -  They are an important sources of vitamins, minerals, energy, antioxidants, and fiber - Choose whole grains, foods high in fiber and minimally processed items  - Typical grain sources include wheat, oats, barley, corn, brown rice, bulgar, farro, millet, polenta, couscous  - Various types of beans include chickpeas, lentils, fava beans, black beans, white beans   Fruits  Veggies - Large quantities of antioxidant rich fruits & veggies; 6 or more servings  - Vegetables can be eaten raw or lightly drizzled with oil and cooked  - Vegetables common to the traditional Mediterranean Diet include: artichokes, arugula, beets, broccoli, brussel sprouts, cabbage,  carrots, celery, collard greens, cucumbers, eggplant, kale, leeks, lemons, lettuce, mushrooms, okra, onions, peas, peppers, potatoes, pumpkin, radishes, rutabaga, shallots, spinach, sweet potatoes, turnips, zucchini - Fruits common to the Mediterranean Diet include: apples, apricots, avocados, cherries, clementines, dates, figs, grapefruits, grapes, melons, nectarines, oranges, peaches, pears, pomegranates, strawberries, tangerines  Fats - Replace butter and margarine with healthy oils, such as olive oil, canola oil, and tahini  - Limit nuts to no more than a handful a day  - Nuts include walnuts, almonds, pecans, pistachios, pine nuts  - Limit or avoid candied, honey roasted or heavily salted nuts - Olives are central to the [employer] - can be eaten whole or used in a variety of dishes   Meats Protein - Limiting red meat: no more than a few times a month - When eating red meat: choose lean cuts and keep the portion to the size of deck of cards - Eggs: approx. 0 to 4 times a week  - Fish and lean poultry: at least 2 a week  - Healthy protein sources include, chicken, [place], lean beef, lamb - Increase intake of seafood such as tuna, salmon, trout, mackerel, shrimp, scallops - Avoid or limit high fat processed meats such as sausage and bacon  Dairy - Include moderate amounts of low fat dairy products  - Focus on healthy dairy such as fat free yogurt, skim milk, low or reduced fat cheese - Limit dairy products higher in fat such as whole or 2% milk, cheese, ice cream  Alcohol - Moderate amounts of red wine is ok  - No more than 5 oz daily for women (all ages) and men older than age 65  - No more than 10 oz of wine daily for men younger than 81  Other - Limit sweets and other desserts  - Use herbs and spices instead of salt to flavor foods  - Herbs and spices common to the traditional Mediterranean Diet include: basil, bay leaves, chives, cloves, cumin, fennel, garlic, lavender, marjoram,  mint, oregano, parsley, pepper, rosemary, sage, savory, sumac, tarragon, thyme   It's not just a diet, it's a lifestyle:  The Mediterranean diet includes lifestyle factors typical of those in the region  Foods, drinks and meals are best eaten with others and savored Daily physical activity is important for overall good health This could be strenuous exercise like running and aerobics This could also be more leisurely activities such as walking, housework, yard-work, or taking the stairs Moderation is the key; a balanced and healthy diet accommodates most foods and drinks Consider portion sizes and frequency of consumption of certain foods   Meal Ideas & Options:  Breakfast:  Whole wheat toast or whole wheat English muffins with peanut butter & hard boiled egg Steel cut oats topped with apples & cinnamon and skim milk  Fresh fruit: banana, strawberries, melon, berries, peaches  Smoothies: strawberries,  bananas, greek yogurt, peanut butter Low fat greek yogurt with blueberries and granola  Egg white omelet with spinach and mushrooms Breakfast couscous: whole wheat couscous, apricots, skim milk, cranberries  Sandwiches:  Hummus and grilled vegetables (peppers, zucchini, squash) on whole wheat bread   Grilled chicken on whole wheat pita with lettuce, tomatoes, cucumbers or tzatziki  [place] salad on whole wheat bread: tuna salad made with greek yogurt, olives, red peppers, capers, green onions Garlic rosemary lamb pita: lamb sauted with garlic, rosemary, salt & pepper; add lettuce, cucumber, greek yogurt to pita - flavor with lemon juice and black pepper  Seafood:  Mediterranean grilled salmon, seasoned with garlic, basil, parsley, lemon juice and black pepper Shrimp, lemon, and spinach whole-grain pasta salad made with low fat greek yogurt  Seared scallops with lemon orzo  Seared tuna steaks seasoned salt, pepper, coriander topped with tomato mixture of olives, tomatoes, olive oil, minced  garlic, parsley, green onions and cappers  Meats:  Herbed greek chicken salad with kalamata olives, cucumber, feta  Red bell peppers stuffed with spinach, bulgur, lean ground beef (or lentils) & topped with feta   Kebabs: skewers of chicken, tomatoes, onions, zucchini, squash  [place] burgers: made with red onions, mint, dill, lemon juice, feta cheese topped with roasted red peppers Vegetarian Cucumber salad: cucumbers, artichoke hearts, celery, red onion, feta cheese, tossed in olive oil & lemon juice  Hummus and whole grain pita points with a greek salad (lettuce, tomato, feta, olives, cucumbers, red onion) Lentil soup with celery, carrots made with vegetable broth, garlic, salt and pepper  Tabouli salad: parsley, bulgur, mint, scallions, cucumbers, tomato, radishes, lemon juice, olive oil, salt and pepper.      American Heart Association (AHA) Exercise Recommendation  Being physically active is important to prevent heart disease and stroke, the nation's No. 1and No. 5killers. To improve overall cardiovascular health, we suggest at least 150 minutes per week of moderate exercise or 75 minutes per week of vigorous exercise (or a combination of moderate and vigorous activity). Thirty minutes a day, five times a week is an easy goal to remember. You will also experience benefits even if you divide your time into two or three segments of 10 to 15 minutes per day.  For people who would benefit from lowering their blood pressure or cholesterol, we recommend 40 minutes of aerobic exercise of moderate to vigorous intensity three to four times a week to lower the risk for heart attack and stroke.  Physical activity is anything that makes you move your body and burn calories.  This includes things like climbing stairs or playing sports. Aerobic exercises benefit your heart, and include walking, jogging, swimming or biking. Strength and stretching exercises are best for overall stamina and flexibility.   The simplest, positive change you can make to effectively improve your heart health is to start walking. It's enjoyable, free, easy, social and great exercise. A walking program is flexible and boasts high success rates because people can stick with it. It's easy for walking to become a regular and satisfying part of life.   For Overall Cardiovascular Health: At least 30 minutes of moderate-intensity aerobic activity at least 5 days per week for a total of 150  OR  At least 25 minutes of vigorous aerobic activity at least 3 days per week for a total of 75 minutes; or a combination of moderate- and vigorous-intensity aerobic activity  AND  Moderate- to high-intensity muscle-strengthening activity at least 2 days per week for  additional health benefits.  For Lowering Blood Pressure and Cholesterol An average 40 minutes of moderate- to vigorous-intensity aerobic activity 3 or 4 times per week  What if I can't make it to the time goal? Something is always better than nothing! And everyone has to start somewhere. Even if you've been sedentary for years, today is the day you can begin to make healthy changes in your life. If you don't think you'll make it for 30 or 40 minutes, set a reachable goal for today. You can work up toward your overall goal by increasing your time as you get stronger. Don't let all-or-nothing thinking rob you of doing what you can every day.  Source:http://www.heart.org

## 2023-08-29 NOTE — Assessment & Plan Note (Signed)
 She reports a neck mass and a palpable lymph node behind her ear, both present for several years. Previous ultrasound was performed. Further evaluation by an ENT specialist is recommended to determine if additional imaging or intervention is needed. - Refer to ENT for further evaluation of neck mass.

## 2023-08-30 ENCOUNTER — Ambulatory Visit (HOSPITAL_BASED_OUTPATIENT_CLINIC_OR_DEPARTMENT_OTHER): Payer: Self-pay | Admitting: Student

## 2023-08-30 LAB — LIPID PANEL
Chol/HDL Ratio: 4.3 ratio (ref 0.0–4.4)
Cholesterol, Total: 228 mg/dL — ABNORMAL HIGH (ref 100–199)
HDL: 53 mg/dL (ref >39)
LDL Chol Calc (NIH): 146 mg/dL — ABNORMAL HIGH (ref 0–99)
Triglycerides: 159 mg/dL — ABNORMAL HIGH (ref 0–149)
VLDL Cholesterol Cal: 29 mg/dL (ref 5–40)

## 2023-08-30 LAB — COMPREHENSIVE METABOLIC PANEL WITH GFR
ALT: 20 IU/L (ref 0–32)
AST: 20 IU/L (ref 0–40)
Albumin: 4.5 g/dL (ref 3.8–4.9)
Alkaline Phosphatase: 74 IU/L (ref 44–121)
BUN/Creatinine Ratio: 20 (ref 9–23)
BUN: 14 mg/dL (ref 6–24)
Bilirubin Total: 0.3 mg/dL (ref 0.0–1.2)
CO2: 22 mmol/L (ref 20–29)
Calcium: 9.6 mg/dL (ref 8.7–10.2)
Chloride: 103 mmol/L (ref 96–106)
Creatinine, Ser: 0.69 mg/dL (ref 0.57–1.00)
Globulin, Total: 2.9 g/dL (ref 1.5–4.5)
Glucose: 88 mg/dL (ref 70–99)
Potassium: 4.2 mmol/L (ref 3.5–5.2)
Sodium: 140 mmol/L (ref 134–144)
Total Protein: 7.4 g/dL (ref 6.0–8.5)
eGFR: 101 mL/min/{1.73_m2} (ref >59)

## 2023-08-30 LAB — CBC WITH DIFFERENTIAL/PLATELET
Basophils Absolute: 0 10*3/uL (ref 0.0–0.2)
Basos: 1 %
EOS (ABSOLUTE): 0.1 10*3/uL (ref 0.0–0.4)
Eos: 2 %
Hematocrit: 43.4 % (ref 34.0–46.6)
Hemoglobin: 12.8 g/dL (ref 11.1–15.9)
Immature Grans (Abs): 0 10*3/uL (ref 0.0–0.1)
Immature Granulocytes: 0 %
Lymphocytes Absolute: 3.2 10*3/uL — ABNORMAL HIGH (ref 0.7–3.1)
Lymphs: 55 %
MCH: 22.7 pg — ABNORMAL LOW (ref 26.6–33.0)
MCHC: 29.5 g/dL — ABNORMAL LOW (ref 31.5–35.7)
MCV: 77 fL — ABNORMAL LOW (ref 79–97)
Monocytes Absolute: 0.5 10*3/uL (ref 0.1–0.9)
Monocytes: 8 %
Neutrophils Absolute: 2 10*3/uL (ref 1.4–7.0)
Neutrophils: 34 %
Platelets: 209 10*3/uL (ref 150–450)
RBC: 5.65 x10E6/uL — ABNORMAL HIGH (ref 3.77–5.28)
RDW: 16.3 % — ABNORMAL HIGH (ref 11.7–15.4)
WBC: 5.8 10*3/uL (ref 3.4–10.8)

## 2023-09-01 LAB — GYN REPORT: PAP Smear Comment: 0

## 2023-09-12 LAB — CYTOLOGY - PAP
Chlamydia: NEGATIVE
Comment: NEGATIVE
Comment: NEGATIVE
Comment: NEGATIVE
Comment: NORMAL
Diagnosis: UNDETERMINED — AB
High risk HPV: NEGATIVE
Neisseria Gonorrhea: NEGATIVE
Trichomonas: NEGATIVE

## 2023-09-13 ENCOUNTER — Encounter (HOSPITAL_BASED_OUTPATIENT_CLINIC_OR_DEPARTMENT_OTHER): Payer: Self-pay

## 2023-09-14 ENCOUNTER — Telehealth (HOSPITAL_BASED_OUTPATIENT_CLINIC_OR_DEPARTMENT_OTHER): Payer: Self-pay | Admitting: Student

## 2023-09-14 NOTE — Telephone Encounter (Signed)
 Called pt and informed her of ENT #.  [facility] [address] [place] [address] [phone]

## 2023-09-14 NOTE — Telephone Encounter (Signed)
 Copied from CRM [id number]. Topic: Referral - Status >> Sep 14, 2023 10:13 AM [place] A wrote: Reason for CRM: Patient following up on ENT referral please contact

## 2023-09-18 ENCOUNTER — Ambulatory Visit (INDEPENDENT_AMBULATORY_CARE_PROVIDER_SITE_OTHER): Admitting: Otolaryngology

## 2023-09-18 ENCOUNTER — Encounter (INDEPENDENT_AMBULATORY_CARE_PROVIDER_SITE_OTHER): Payer: Self-pay | Admitting: Otolaryngology

## 2023-09-18 VITALS — BP 120/82 | HR 93

## 2023-09-18 DIAGNOSIS — Q181 Preauricular sinus and cyst: Secondary | ICD-10-CM | POA: Diagnosis not present

## 2023-09-18 DIAGNOSIS — L720 Epidermal cyst: Secondary | ICD-10-CM

## 2023-09-18 NOTE — Progress Notes (Signed)
 ENT CONSULT:  Reason for Consult: sensation of neck lump left posterior neck and cyst/nodule behind right ear   HPI: Discussed the use of AI scribe software for clinical note transcription with the patient, who gave verbal consent to proceed.  History of Present Illness Avalynne Rosario is a 58 year old female who presents with a nodule on the back of her right ear and a sensation of a lump left posterior neck.   The left posterior neck area sometimes swells, particularly during episodes of sinus infections or similar illnesses. The nodule behind right ear is occasionally painful, especially in the mornings, which she attributes to her sleeping position.  The nodule is also located behind her ear. There have been no significant changes or symptoms such as redness, drainage, or increased swelling. She has not received any prior treatment for this nodule and is seeking evaluation to ensure it is not a cause for concern.  No current symptoms of illness or infection.  Past Medical History:  Diagnosis Date   Abnormal uterine bleeding 12/13/2016   Allergy    seasonal   ASCUS of cervix with negative high risk HPV 12/22/2016   Needs repeat with co-testing 3 yrs   Cardiac murmur    Fibroids 2017   Hyperlipidemia    Obese    Prediabetes    SVT (supraventricular tachycardia) (HCC) 2009    Past Surgical History:  Procedure Laterality Date   COLPOSCOPY     maybe earlier 2000's x1    Family History  Problem Relation Age of Onset   Hypertension Mother    Hypertension Sister    Hypertension Brother    Hypertension Brother     Social History:  reports that she has never smoked. She has never been exposed to tobacco smoke. She has never used smokeless tobacco. She reports current alcohol use of about 1.0 standard drink of alcohol per week. She reports that she does not use drugs.  Allergies: No Known Allergies  Medications: I have reviewed the patient's current medications.  The PMH,  PSH, Medications, Allergies, and SH were reviewed and updated.  ROS: Constitutional: Negative for fever, weight loss and weight gain. Cardiovascular: Negative for chest pain and dyspnea on exertion. Respiratory: Is not experiencing shortness of breath at rest. Gastrointestinal: Negative for nausea and vomiting. Neurological: Negative for headaches. Psychiatric: The patient is not nervous/anxious  Blood pressure 120/82, pulse 93, last menstrual period 11/14/2016, SpO2 94%. There is no height or weight on file to calculate BMI.  PHYSICAL EXAM:  Exam: General: Well-developed, well-nourished Communication and Voice: Clear pitch and clarity Respiratory Respiratory effort: Equal inspiration and expiration without stridor Cardiovascular Peripheral Vascular: Warm extremities with equal color/perfusion Eyes: No nystagmus with equal extraocular motion bilaterally Neuro/Psych/Balance: Patient oriented to person, place, and time; Appropriate mood and affect; Gait is intact with no imbalance; Cranial nerves I-XII are intact Head and Face Inspection: Normocephalic and atraumatic without mass or lesion Palpation: Facial skeleton intact without bony stepoffs Salivary Glands: No mass or tenderness Facial Strength: Facial motility symmetric and full bilaterally ENT Pinna: External ear intact and fully developed 0.5 cm soft round cystic area subcutaneous and located behind right auricle, no erythema or drainage External canal: Canal is patent with intact skin Tympanic Membrane: Clear and mobile External Nose: No scar or anatomic deformity Lips, Teeth, and gums: Mucosa and teeth intact and viable TMJ: No pain to palpation with full mobility Oral cavity/oropharynx: No erythema or exudate, no lesions present Neck Neck and Trachea: Midline  trachea without mass or lesion Thyroid : No mass or nodularity Lymphatics: No lymphadenopathy Left posterior neck without visible or palpable masses, area of  concern feels like muscle belly   Studies Reviewed: Neck U/S 05/05/23 Left neck nodule MPRESSION: No discrete sonographic abnormality identified in the region of concern in the left lateral neck.  Assessment/Plan: Encounter Diagnosis  Name Primary?   Cyst of skin and subcutaneous tissue Yes    Assessment and Plan Assessment & Plan Cystic bump behind R earlobe Soft, rolling, likely benign cystic formation, possibly lymph node swelling. - Monitor for infection signs: redness, swelling, drainage. - Advise return if bump becomes painful, red, swollen, or drains. - Consider antibiotics if infection develops. - Discuss surgical removal if problematic.  Sensation of a lump left posterior neck Negative soft tissue neck U/S. On exam the area of concern feels like a palpable muscle belly and identical to palpation of the opposite side.  - patient was reassured       Thank you for allowing me to participate in the care of this patient. Please do not hesitate to contact me with any questions or concerns.   Elena Larry, MD Otolaryngology [facility] Phone: [phone] Fax: [phone]    09/18/2023, 11:41 AM

## 2023-09-27 ENCOUNTER — Ambulatory Visit (HOSPITAL_BASED_OUTPATIENT_CLINIC_OR_DEPARTMENT_OTHER)
Admission: RE | Admit: 2023-09-27 | Discharge: 2023-09-27 | Disposition: A | Discharge status: Home / Self Care | Payer: Self-pay | Source: Ambulatory Visit | Attending: Family Medicine | Admitting: Family Medicine

## 2023-09-27 ENCOUNTER — Other Ambulatory Visit (HOSPITAL_BASED_OUTPATIENT_CLINIC_OR_DEPARTMENT_OTHER): Payer: Self-pay

## 2023-09-27 ENCOUNTER — Encounter (HOSPITAL_BASED_OUTPATIENT_CLINIC_OR_DEPARTMENT_OTHER): Payer: Self-pay

## 2023-09-27 VITALS — BP 118/79 | HR 90 | Temp 97.9°F | Resp 20

## 2023-09-27 DIAGNOSIS — J0110 Acute frontal sinusitis, unspecified: Secondary | ICD-10-CM

## 2023-09-27 MED ORDER — AMOXICILLIN 875 MG PO TABS
875.0000 mg | ORAL_TABLET | Freq: Two times a day (BID) | ORAL | 0 refills | Status: AC
  Filled 2023-09-27: qty 14, 7d supply, fill #0

## 2023-09-27 NOTE — Discharge Instructions (Signed)
 Treating you for a sinus infection.  Take the antibiotics as prescribed.  You can continue over-the-counter medications as needed.  Follow-up as needed

## 2023-09-27 NOTE — ED Provider Notes (Signed)
 [facility]    CSN: [id number] Arrival date & time: 09/27/23  0902      History   Chief Complaint Chief Complaint  Patient presents with   Facial Pain    been having a slight headache on left side of head. ear seems to get clogged up some, in the mornings, when I wake up my throat feels like something is lodged in it long ways. This  started last week been taking over the counter allergy medicine - Entered by patient    HPI Susan Rosario is a 58 y.o. female.   58 year old female presents today with  c/o left sided facial pain, left ear fullness, HA, post nasal drainage, and sore throat for the last 7-8 days. She has used Flonase, guaifenesin  with some relief. She has a slight cough that started this morning. Pt denies fever, body aches, and chills.     Past Medical History:  Diagnosis Date   Abnormal uterine bleeding 12/13/2016   Allergy    seasonal   ASCUS of cervix with negative high risk HPV 12/22/2016   Needs repeat with co-testing 3 yrs   Cardiac murmur    Fibroids 2017   Hyperlipidemia    Obese    Prediabetes    SVT (supraventricular tachycardia) (HCC) 2009    Patient Active Problem List   Diagnosis Date Noted   Pap smear for cervical cancer screening 08/29/2023   Prediabetes 08/29/2023   Adult general medical exam 08/29/2023   Neck nodule 05/01/2023   Seasonal allergies 05/01/2023   Hyperlipidemia 06/14/2022   Cardiac murmur 06/14/2022   Obese 06/14/2022   ASCUS of cervix with negative high risk HPV 12/22/2016   Abnormal uterine bleeding 12/13/2016   Fibroids 2017    Past Surgical History:  Procedure Laterality Date   COLPOSCOPY     maybe earlier 2000's x1    OB History     Gravida  2   Para  2   Term  2   Preterm      AB      Living  2      SAB      IAB      Ectopic      Multiple      Live Births  2            Home Medications    Prior to Admission medications   Medication Sig Start Date End Date  Taking? Authorizing Provider  amoxicillin  (AMOXIL ) 875 MG tablet Take 1 tablet (875 mg total) by mouth 2 (two) times daily for 7 days. 09/27/23 10/04/23 Yes Linnae Rasool A, FNP  aspirin-sod bicarb-citric acid (ALKA-SELTZER) 325 MG TBEF tablet Take 325 mg by mouth every 6 (six) hours as needed. For gas    [provider]  cetirizine (ZYRTEC) 10 MG tablet Take 10 mg by mouth daily.    [provider]  fluticasone (FLONASE) 50 MCG/ACT nasal spray Place 2 sprays into both nostrils daily.    [provider]  Guaifenesin  1200 MG TB12 Take 1 tablet (1,200 mg total) by mouth in the morning and at bedtime. 03/06/23   Enedelia Dorna HERO, FNP  montelukast  (SINGULAIR ) 10 MG tablet Take 1 tablet (10 mg total) by mouth at bedtime. Patient not taking: Reported on 09/18/2023 05/01/23   Rothfuss, Jacob T, PA-C  Multiple Vitamin (MULTIVITAMIN) capsule Take 1 capsule by mouth daily. 50+    [provider]    Family History Family History  Problem Relation  Age of Onset   Hypertension Mother    Hypertension Sister    Hypertension Brother    Hypertension Brother     Social History Social History   Tobacco Use   Smoking status: Never    Passive exposure: Never   Smokeless tobacco: Never  Vaping Use   Vaping status: Never Used  Substance Use Topics   Alcohol use: Yes    Alcohol/week: 1.0 standard drink of alcohol    Types: 1 Glasses of wine per week    Comment: occasionally   Drug use: No     Allergies   Patient has no known allergies.   Review of Systems Review of Systems See HPI  Physical Exam Triage Vital Signs ED Triage Vitals [09/27/23 0912]  Encounter Vitals Group     BP      Girls Systolic BP Percentile      Girls Diastolic BP Percentile      Boys Systolic BP Percentile      Boys Diastolic BP Percentile      Pulse      Resp      Temp      Temp src      SpO2      Weight      Height      Head Circumference      Peak Flow      Pain Score 6      Pain Loc      Pain Education      Exclude from Growth Chart    No data found.  Updated Vital Signs BP 118/79 (BP Location: Right Arm)   Pulse 90   Temp 97.9 F (36.6 C) (Oral)   Resp 20   LMP 11/14/2016   SpO2 96%   Visual Acuity Right Eye Distance:   Left Eye Distance:   Bilateral Distance:    Right Eye Near:   Left Eye Near:    Bilateral Near:     Physical Exam Constitutional:      General: She is not in acute distress.    Appearance: Normal appearance. She is not ill-appearing, toxic-appearing or diaphoretic.  HENT:     Head: Normocephalic and atraumatic.     Right Ear: Tympanic membrane and ear canal normal.     Left Ear: Tympanic membrane and ear canal normal.     Nose: Congestion present.     Mouth/Throat:     Pharynx: Oropharynx is clear.  Eyes:     Conjunctiva/sclera: Conjunctivae normal.  Cardiovascular:     Rate and Rhythm: Normal rate and regular rhythm.     Pulses: Normal pulses.     Heart sounds: Normal heart sounds.  Pulmonary:     Effort: Pulmonary effort is normal.     Breath sounds: Normal breath sounds.  Skin:    General: Skin is warm and dry.  Neurological:     Mental Status: She is alert.  Psychiatric:        Mood and Affect: Mood normal.      UC Treatments / Results  Labs (all labs ordered are listed, but only abnormal results are displayed) Labs Reviewed - No data to display  EKG   Radiology No results found.  Procedures Procedures (including critical care time)  Medications Ordered in UC Medications - No data to display  Initial Impression / Assessment and Plan / UC Course  I have reviewed the triage vital signs and the nursing notes.  Pertinent labs & imaging results that  were available during my care of the patient were reviewed by me and considered in my medical decision making (see chart for details).     Sinusitis-treated for sinus infection due to continued symptoms for approximately 8 days and not  improving.  Will treat with amoxicillin  at this time.  Continue over-the-counter occasions for symptoms as needed.  Follow-up as needed Final Clinical Impressions(s) / UC Diagnoses   Final diagnoses:  Acute non-recurrent frontal sinusitis     Discharge Instructions      Treating you for a sinus infection.  Take the antibiotics as prescribed. You can continue over-the-counter medications as needed.  Follow-up as needed     ED Prescriptions     Medication Sig Dispense Auth. Provider   amoxicillin  (AMOXIL ) 875 MG tablet Take 1 tablet (875 mg total) by mouth 2 (two) times daily for 7 days. 14 tablet Adah Wilbert LABOR, FNP      PDMP not reviewed this encounter.   Adah Wilbert LABOR, FNP 09/27/23 [phone]

## 2023-09-27 NOTE — ED Triage Notes (Signed)
 Pt c/o left sided facial pain, left ear fullness, HA, post nasal drainage, and sore throat for the last 7-8 days. She has used Flonase, guaifenesin  with some relief. She has a slight cough that started this morning. Pt denies fever, body aches, and chills.

## 2024-08-29 ENCOUNTER — Encounter (HOSPITAL_BASED_OUTPATIENT_CLINIC_OR_DEPARTMENT_OTHER): Admitting: Student
